# Patient Record
Sex: Female | Born: 1935 | Race: Black or African American | Hispanic: No | Smoking: Never smoker
Health system: Southern US, Community
[De-identification: ages and names within clinical notes are randomized; demographics above are authoritative.]

## PROBLEM LIST (undated history)

## (undated) DIAGNOSIS — K589 Irritable bowel syndrome without diarrhea: Secondary | ICD-10-CM

## (undated) DIAGNOSIS — E669 Obesity, unspecified: Secondary | ICD-10-CM

## (undated) DIAGNOSIS — E1169 Type 2 diabetes mellitus with other specified complication: Secondary | ICD-10-CM

## (undated) DIAGNOSIS — Z8719 Personal history of other diseases of the digestive system: Secondary | ICD-10-CM

## (undated) DIAGNOSIS — G8929 Other chronic pain: Secondary | ICD-10-CM

## (undated) DIAGNOSIS — Z96653 Presence of artificial knee joint, bilateral: Secondary | ICD-10-CM

## (undated) DIAGNOSIS — Z9889 Other specified postprocedural states: Secondary | ICD-10-CM

## (undated) DIAGNOSIS — K5792 Diverticulitis of intestine, part unspecified, without perforation or abscess without bleeding: Secondary | ICD-10-CM

## (undated) DIAGNOSIS — I10 Essential (primary) hypertension: Secondary | ICD-10-CM

## (undated) DIAGNOSIS — M1990 Unspecified osteoarthritis, unspecified site: Secondary | ICD-10-CM

## (undated) DIAGNOSIS — I252 Old myocardial infarction: Secondary | ICD-10-CM

## (undated) DIAGNOSIS — E119 Type 2 diabetes mellitus without complications: Secondary | ICD-10-CM

## (undated) DIAGNOSIS — G8190 Hemiplegia, unspecified affecting unspecified side: Secondary | ICD-10-CM

## (undated) DIAGNOSIS — Z8673 Personal history of transient ischemic attack (TIA), and cerebral infarction without residual deficits: Secondary | ICD-10-CM

## (undated) DIAGNOSIS — Z950 Presence of cardiac pacemaker: Secondary | ICD-10-CM

## (undated) DIAGNOSIS — J449 Chronic obstructive pulmonary disease, unspecified: Secondary | ICD-10-CM

## (undated) HISTORY — DX: Type 2 diabetes mellitus with other specified complication: E11.69

## (undated) HISTORY — DX: Personal history of other diseases of the digestive system: Z87.19

## (undated) HISTORY — PX: REPLACEMENT TOTAL KNEE BILATERAL: SUR1225

## (undated) HISTORY — DX: Essential (primary) hypertension: I10

## (undated) HISTORY — DX: Old myocardial infarction: I25.2

## (undated) HISTORY — DX: Diverticulitis of intestine, part unspecified, without perforation or abscess without bleeding: K57.92

## (undated) HISTORY — DX: Morbid (severe) obesity due to excess calories: E66.01

## (undated) HISTORY — DX: Other specified postprocedural states: Z98.890

## (undated) HISTORY — PX: CHOLECYSTECTOMY: SHX55

## (undated) HISTORY — DX: Obesity, unspecified: E66.9

## (undated) HISTORY — DX: Presence of artificial knee joint, bilateral: Z96.653

## (undated) HISTORY — DX: Chronic obstructive pulmonary disease, unspecified: J44.9

## (undated) HISTORY — DX: Hemiplegia, unspecified affecting unspecified side: G81.90

## (undated) HISTORY — DX: Type 2 diabetes mellitus without complications: E11.9

## (undated) HISTORY — DX: Unspecified osteoarthritis, unspecified site: M19.90

## (undated) HISTORY — DX: Irritable bowel syndrome without diarrhea: K58.9

## (undated) HISTORY — DX: Other chronic pain: G89.29

## (undated) HISTORY — PX: HERNIA REPAIR: SHX51

## (undated) HISTORY — DX: Personal history of transient ischemic attack (TIA), and cerebral infarction without residual deficits: Z86.73

## (undated) HISTORY — PX: PACEMAKER INSERTION: SHX728

## (undated) HISTORY — DX: Presence of cardiac pacemaker: Z95.0

---

## 2020-02-26 ENCOUNTER — Other Ambulatory Visit: Payer: Self-pay

## 2020-02-26 ENCOUNTER — Encounter (HOSPITAL_COMMUNITY): Payer: Self-pay | Admitting: Emergency Medicine

## 2020-02-26 ENCOUNTER — Emergency Department (HOSPITAL_COMMUNITY)
Admission: EM | Admit: 2020-02-26 | Discharge: 2020-02-26 | Disposition: A | Discharge status: Home / Self Care | Payer: Medicare Other | Attending: Emergency Medicine | Admitting: Emergency Medicine

## 2020-02-26 DIAGNOSIS — Z7901 Long term (current) use of anticoagulants: Secondary | ICD-10-CM | POA: Insufficient documentation

## 2020-02-26 DIAGNOSIS — Z794 Long term (current) use of insulin: Secondary | ICD-10-CM | POA: Insufficient documentation

## 2020-02-26 DIAGNOSIS — Z950 Presence of cardiac pacemaker: Secondary | ICD-10-CM | POA: Diagnosis not present

## 2020-02-26 DIAGNOSIS — I10 Essential (primary) hypertension: Secondary | ICD-10-CM | POA: Diagnosis not present

## 2020-02-26 DIAGNOSIS — E119 Type 2 diabetes mellitus without complications: Secondary | ICD-10-CM | POA: Diagnosis not present

## 2020-02-26 DIAGNOSIS — Z79899 Other long term (current) drug therapy: Secondary | ICD-10-CM | POA: Diagnosis not present

## 2020-02-26 DIAGNOSIS — R040 Epistaxis: Secondary | ICD-10-CM | POA: Diagnosis not present

## 2020-02-26 DIAGNOSIS — Z7982 Long term (current) use of aspirin: Secondary | ICD-10-CM | POA: Diagnosis not present

## 2020-02-26 MED ORDER — OXYMETAZOLINE HCL 0.05 % NA SOLN
1.0000 | Freq: Once | NASAL | Status: AC
  Administered 2020-02-26: 1 via NASAL
  Filled 2020-02-26: qty 30

## 2020-02-26 NOTE — ED Provider Notes (Signed)
[facility] EMERGENCY DEPARTMENT Provider Note   CSN: [id number] Arrival date & time: 02/26/20  0156     History Chief Complaint  Patient presents with  . Epistaxis    Terri Owen is a 87 y.o. female.  HPI 87 year old female with history of hypertension, post pacemaker placement and distant history, diabetes, UTI, presents today with nosebleed.  She reports that she had some bleeding coming from her left nares.  She has not had similar episodes in the past.  She can some control with pressure.  She denies any lightheadedness, other sites of bleeding, pain, or trauma.  He is on aspirin but no other blood thinner    History reviewed. No pertinent past medical history.  There are no problems to display for this patient.   History reviewed. No pertinent surgical history.   OB History   No obstetric history on file.     History reviewed. No pertinent family history.     Home Medications Prior to Admission medications   Medication Sig Start Date End Date Taking? Authorizing Provider  aspirin EC 81 MG tablet Take 81 mg by mouth daily. Swallow whole.   Yes [provider]  atorvastatin (LIPITOR) 40 MG tablet Take 40 mg by mouth daily.   Yes [provider]  Cholecalciferol (VITAMIN D3) 50 MCG (2000 UT) CAPS Take 2,000 Units by mouth daily.   Yes [provider]  DULoxetine (CYMBALTA) 30 MG capsule Take 30 mg by mouth daily.   Yes [provider]  ezetimibe (ZETIA) 10 MG tablet Take 10 mg by mouth daily.   Yes [provider]  furosemide (LASIX) 20 MG tablet Take 20 mg by mouth daily.   Yes [provider]  insulin glargine (LANTUS) 100 unit/mL SOPN Inject 20 Units into the skin at bedtime.   Yes [provider]  insulin lispro (HUMALOG) 100 UNIT/ML injection Inject 12 Units into the skin See admin instructions. 12 units base with 2-10 units sliding scale.  160-199=2u, 200-239=3u, 240-279=4u,  280-319=5u,320-359=6u,360-399=7u,400-439=8u,440-479=9u,480 and above= 10u   Yes [provider]  isosorbide mononitrate (IMDUR) 60 MG 24 hr tablet Take 60 mg by mouth daily.   Yes [provider]  losartan (COZAAR) 100 MG tablet Take 100 mg by mouth daily.   Yes [provider]  metoprolol tartrate (LOPRESSOR) 25 MG tablet Take 25 mg by mouth 2 (two) times daily.   Yes [provider]  Multiple Vitamins-Minerals (CENTRUM SILVER 50+WOMEN PO) Take 1 tablet by mouth daily.   Yes [provider]  PAPAYA ENZYME PO Take 3-4 tablets by mouth daily as needed (constipation).   Yes [provider]  simethicone (MYLICON) 125 MG chewable tablet Chew 125 mg by mouth See admin instructions. 2 tabs in the am, 1 tab hs   Yes [provider]  ticagrelor (BRILINTA) 60 MG TABS tablet Take 60 mg by mouth 2 (two) times daily.   Yes [provider]  Zinc 50 MG TABS Take 50 mg by mouth daily.   Yes [provider]    Allergies    Sulfa antibiotics  Review of Systems   Review of Systems  All other systems reviewed and are negative.   Physical Exam Updated Vital Signs BP (!) 142/58 (BP Location: Right Arm)   Pulse 68   Temp 98 F (36.7 C) (Oral)   Resp 16   SpO2 99%   Physical Exam Vitals and nursing note reviewed.  Constitutional:  Appearance: Normal appearance. She is obese.  HENT:     Head: Normocephalic.     Right Ear: External ear normal.     Left Ear: External ear normal.     Nose:     Comments: Clot noted noted in left nares After clot clear there is some erythematous area in the septum without active bleeding Right nares has some dried blood at the external nares but no clot no active bleeding    Mouth/Throat:     Comments: Dentures in place no blood noted in posterior oropharynx Eyes:     Pupils: Pupils are equal, round, and reactive to light.  Cardiovascular:     Rate and Rhythm: Normal rate.   Pulmonary:     Effort: Pulmonary effort is normal.  Abdominal:     Palpations: Abdomen is soft.  Musculoskeletal:     Cervical back: Normal range of motion.  Skin:    General: Skin is warm and dry.     Capillary Refill: Capillary refill takes less than 2 seconds.     Coloration: Skin is not pale.  Neurological:     General: No focal deficit present.     Mental Status: She is alert.  Psychiatric:        Mood and Affect: Mood normal.     ED Results / Procedures / Treatments   Labs (all labs ordered are listed, but only abnormal results are displayed) Labs Reviewed - No data to display  EKG None  Radiology No results found.  Procedures .Epistaxis Management  Date/Time: 02/26/2020 7:30 AM Performed by: Margarita Grizzle, MD Authorized by: Margarita Grizzle, MD   Consent:    Consent obtained:  Verbal   Consent given by:  Patient   Risks, benefits, and alternatives were discussed: yes     Risks discussed:  Bleeding   Alternatives discussed:  No treatment Universal protocol:    Patient identity confirmed:  Verbally with patient Anesthesia:    Anesthesia method:  Topical application   Topical anesthetic:  Lidocaine gel and epinephrine Procedure details:    Treatment site:  L anterior   Treatment method:  Silver nitrate   Treatment complexity:  Extensive   Treatment episode: initial   Post-procedure details:    Assessment:  Bleeding stopped   Procedure completion:  Tolerated well, no immediate complications   (including critical care time)  Medications Ordered in ED Medications  oxymetazoline (AFRIN) 0.05 % nasal spray 1 spray (has no administration in time range)    ED Course  I have reviewed the triage vital signs and the nursing notes.  Pertinent labs & imaging results that were available during my care of the patient were reviewed by me and considered in my medical decision making (see chart for details).    MDM Rules/Calculators/A&P                          87 year old female presents today with left anterior nasal bleeding.  Clot and noted and removed.  Bleeding controlled with silver nitrate.  Patient observed x45 minutes and continues to have bleeding. Discussed home epistaxis management including not blowing through nares, touching nares, sneezing with mouth open, bleeding control if there is recurrence with pressure.  Discussed return precautions and need for follow-up and voices understanding Final Clinical Impression(s) / ED Diagnoses Final diagnoses:  Epistaxis    Rx / DC Orders ED Discharge Orders    None       Satya Buttram,  Duwayne Heck, MD 02/26/20 [id number]

## 2020-02-26 NOTE — Discharge Instructions (Signed)
Please do not touch nose, sneeze with mouth open. Humidify air If bleeding recurs, use pressure as discussed and return if needed.

## 2020-02-26 NOTE — ED Triage Notes (Signed)
Pt was at home and she said he nose just started bleeding. Pt said she is only taken 81 mg aspirin. Pt said no injury no fall.

## 2020-02-26 NOTE — ED Notes (Signed)
Patient discharge instructions reviewed with the patient. The patient verbalized understanding of instructions. Patient discharged. 

## 2020-02-29 ENCOUNTER — Other Ambulatory Visit: Payer: Self-pay | Admitting: General Practice

## 2020-02-29 DIAGNOSIS — E2839 Other primary ovarian failure: Secondary | ICD-10-CM

## 2020-02-29 DIAGNOSIS — Z780 Asymptomatic menopausal state: Secondary | ICD-10-CM

## 2020-04-02 ENCOUNTER — Telehealth: Payer: Self-pay

## 2020-04-02 NOTE — Telephone Encounter (Signed)
NOTES ON FILE FROM [facility] [phone], SENT REFERRAL TO SCHEDULING 

## 2020-04-20 ENCOUNTER — Other Ambulatory Visit: Payer: Self-pay

## 2020-04-20 ENCOUNTER — Ambulatory Visit (INDEPENDENT_AMBULATORY_CARE_PROVIDER_SITE_OTHER): Payer: Medicare Other | Admitting: Cardiology

## 2020-04-20 VITALS — BP 120/60 | HR 73 | Ht 63.0 in | Wt 269.0 lb

## 2020-04-20 DIAGNOSIS — Z950 Presence of cardiac pacemaker: Secondary | ICD-10-CM | POA: Diagnosis not present

## 2020-04-20 DIAGNOSIS — I10 Essential (primary) hypertension: Secondary | ICD-10-CM

## 2020-04-20 NOTE — Patient Instructions (Signed)
Medication Instructions:  The current medical regimen is effective;  continue present plan and medications.  *If you need a refill on your cardiac medications before your next appointment, please call your pharmacy*  You have been referred to Electrophysiology to follow your pacemaker.  Follow-Up: At [facility], you and your health needs are our priority.  As part of our continuing mission to provide you with exceptional heart care, we have created designated Provider Care Teams.  These Care Teams include your primary Cardiologist (physician) and Advanced Practice Providers (APPs -  Physician Assistants and Nurse Practitioners) who all work together to provide you with the care you need, when you need it.  We recommend signing up for the patient portal called "MyChart".  Sign up information is provided on this After Visit Summary.  MyChart is used to connect with patients for Virtual Visits (Telemedicine).  Patients are able to view lab/test results, encounter notes, upcoming appointments, etc.  Non-urgent messages can be sent to your provider as well.   To learn more about what you can do with MyChart, go to [email or web address].    Your next appointment:   6 month(s)  The format for your next appointment:   In Person  Provider:   Donato Schultz, MD  Thank you for choosing [facility]!!

## 2020-04-20 NOTE — Progress Notes (Signed)
Cardiology Office Note:    Date:  04/20/2020   ID:  Terri Owen, DOB 1933-06-04, MRN [id number]  PCP:  Karl Ito, DO   [facility]  Cardiologist:  Donato Schultz, MD  Advanced Practice Provider:  No care team member to display Electrophysiologist:  None       Referring MD: Karl Ito, DO     History of Present Illness:    Terri Owen is a 87 y.o. female here for the evaluation of pacemaker, abnormal EKG at the request of Dr. Allena Katz.  Prior history of stroke in November 2021.  At that time she was placed on both Brilinta as well as aspirin but then developed significant nose bleeding issues.  Aspirin was stopped and she is doing better.  Her daughter accompanies her during this visit.  She moved from [place].  Previously both of them lived in [place].  Has diabetes hypertension, hemoglobin A1c 6.6 compliant.  Has also had some fatigue.   LDL 43 ALT 19 hemoglobin 11.8  Past Medical History:  Diagnosis Date  . Arthritis   . Chronic abdominal pain   . COPD (chronic obstructive pulmonary disease) (HCC)   . Diabetes mellitus type 2 in obese (HCC)   . Diverticulitis   . DM (diabetes mellitus) (HCC)   . H/O hernia repair   . H/O: stroke   . Hemiparesis (HCC)   . History of bilateral knee replacement   . History of MI (myocardial infarction)   . HTN (hypertension)   . IBS (irritable bowel syndrome)   . Morbidly obese (HCC)   . Pacemaker     Past Surgical History:  Procedure Laterality Date  . HERNIA REPAIR    . PACEMAKER INSERTION      Current Medications: Current Meds  Medication Sig  . amoxicillin-clavulanate (AUGMENTIN) 500-125 MG tablet Take 1 tablet by mouth See admin instructions. Every 8 hours x 10 days  . atorvastatin (LIPITOR) 40 MG tablet Take 40 mg by mouth daily.  . Cholecalciferol (VITAMIN D3) 50 MCG (2000 UT) CAPS Take 2,000 Units by mouth daily.  . DULoxetine (CYMBALTA) 30 MG capsule Take 30 mg by mouth daily.   Marland Kitchen ezetimibe (ZETIA) 10 MG tablet Take 10 mg by mouth daily.  . furosemide (LASIX) 20 MG tablet Take 20 mg by mouth daily.  . insulin glargine (LANTUS) 100 unit/mL SOPN Inject 20 Units into the skin at bedtime.  . insulin lispro (HUMALOG) 100 UNIT/ML injection Inject 12 Units into the skin See admin instructions. 12 units base with 2-10 units sliding scale.  160-199=2u, 200-239=3u, 240-279=4u, 280-319=5u,320-359=6u,360-399=7u,400-439=8u,440-479=9u,480 and above= 10u  . isosorbide mononitrate (IMDUR) 60 MG 24 hr tablet Take 60 mg by mouth daily.  Marland Kitchen losartan (COZAAR) 100 MG tablet Take 100 mg by mouth daily.  . metoprolol tartrate (LOPRESSOR) 25 MG tablet Take 25 mg by mouth 2 (two) times daily.  . Multiple Vitamins-Minerals (CENTRUM SILVER 50+WOMEN PO) Take 1 tablet by mouth daily.  Marland Kitchen PAPAYA ENZYME PO Take 3-4 tablets by mouth daily as needed (constipation).  . simethicone (MYLICON) 125 MG chewable tablet Chew 125 mg by mouth See admin instructions. 2 tabs in the am, 1 tab hs  . ticagrelor (BRILINTA) 60 MG TABS tablet Take 60 mg by mouth 2 (two) times daily.  . Zinc 50 MG TABS Take 50 mg by mouth daily.     Allergies:   Sulfa antibiotics   Social History   Socioeconomic History  . Marital status: Widowed  Spouse name: Not on file  . Number of children: Not on file  . Years of education: Not on file  . Highest education level: Not on file  Occupational History  . Not on file  Tobacco Use  . Smoking status: Not on file  . Smokeless tobacco: Not on file  Substance and Sexual Activity  . Alcohol use: Not on file  . Drug use: Not on file  . Sexual activity: Not on file  Other Topics Concern  . Not on file  Social History Narrative  . Not on file   Social Determinants of Health   Financial Resource Strain: Not on file  Food Insecurity: Not on file  Transportation Needs: Not on file  Physical Activity: Not on file  Stress: Not on file  Social Connections: Not on file       ROS:   Please see the history of present illness.     All other systems reviewed and are negative.  EKGs/Labs/Other Studies Reviewed:    The following studies were reviewed today: Records from [facility] health reviewed  EKG:  EKG is  ordered today.  The ekg ordered today demonstrates sinus rhythm with PACs left bundle branch block heart rate 73 bpm  Recent Labs: No results found for requested labs within last 8760 hours.  Recent Lipid Panel No results found for: CHOL, TRIG, HDL, CHOLHDL, VLDL, LDLCALC, LDLDIRECT   Risk Assessment/Calculations:      Physical Exam:    VS:  BP 120/60 (BP Location: Right Arm, Patient Position: Sitting, Cuff Size: Large)   Pulse 73   Ht 5\' 3"  (1.6 m)   Wt 269 lb (122 kg)   BMI 47.65 kg/m     Wt Readings from Last 3 Encounters:  04/20/20 269 lb (122 kg)     GEN:  Well nourished, well developed in no acute distress HEENT: Normal NECK: No JVD; No carotid bruits LYMPHATICS: No lymphadenopathy CARDIAC: ectopy RRR, no murmurs, rubs, gallops RESPIRATORY:  Clear to auscultation without rales, wheezing or rhonchi  ABDOMEN: Soft, non-tender, non-distended MUSCULOSKELETAL:  No edema; No deformity  SKIN: Warm and dry NEUROLOGIC:  Alert and oriented x 3 PSYCHIATRIC:  Normal affect   ASSESSMENT:    1. Hypertension, unspecified type   2. Artificial cardiac pacemaker    PLAN:    In order of problems listed above:  Stroke November 2021 -Was on both Brilinta as well as aspirin post stroke.  Had severe nosebleeds.  Aspirin was taken off.  She is now on Brilinta only and doing well.  Okay to continue with Brilinta at this point.  If bleeding were to occur again we would switch over to aspirin monotherapy.  [employer]tronic pacemaker -Just replaced last year.  We will set up with pacemaker device clinic here.  This was placed in December 2021.  Left bundle branch block -Noted on ECG, no syncope.  PACs noted as well.  Diabetes with hypertension and  hyperlipidemia -Currently seeing Dr. [place] with [facility].    Medication Adjustments/Labs and Tests Ordered: Current medicines are reviewed at length with the patient today.  Concerns regarding medicines are outlined above.  Orders Placed This Encounter  Procedures  . Ambulatory referral to Cardiac Electrophysiology  . EKG 12-Lead   No orders of the defined types were placed in this encounter.   Patient Instructions  Medication Instructions:  The current medical regimen is effective;  continue present plan and medications.  *If you need a refill on your cardiac  medications before your next appointment, please call your pharmacy*  You have been referred to Electrophysiology to follow your pacemaker.  Follow-Up: At [facility], you and your health needs are our priority.  As part of our continuing mission to provide you with exceptional heart care, we have created designated Provider Care Teams.  These Care Teams include your primary Cardiologist (physician) and Advanced Practice Providers (APPs -  Physician Assistants and Nurse Practitioners) who all work together to provide you with the care you need, when you need it.  We recommend signing up for the patient portal called "MyChart".  Sign up information is provided on this After Visit Summary.  MyChart is used to connect with patients for Virtual Visits (Telemedicine).  Patients are able to view lab/test results, encounter notes, upcoming appointments, etc.  Non-urgent messages can be sent to your provider as well.   To learn more about what you can do with MyChart, go to [email or web address].    Your next appointment:   6 month(s)  The format for your next appointment:   In Person  Provider:   Donato Schultz, MD  Thank you for choosing [facility]!!        Signed, Donato Schultz, MD  04/20/2020 3:00 PM    [facility]

## 2020-04-27 ENCOUNTER — Encounter: Payer: Self-pay | Admitting: Cardiology

## 2020-04-27 ENCOUNTER — Ambulatory Visit (INDEPENDENT_AMBULATORY_CARE_PROVIDER_SITE_OTHER): Payer: Medicare Other | Admitting: Cardiology

## 2020-04-27 ENCOUNTER — Other Ambulatory Visit: Payer: Self-pay

## 2020-04-27 VITALS — BP 144/70 | HR 72 | Ht 63.0 in | Wt 268.4 lb

## 2020-04-27 DIAGNOSIS — Z950 Presence of cardiac pacemaker: Secondary | ICD-10-CM | POA: Insufficient documentation

## 2020-04-27 DIAGNOSIS — R001 Bradycardia, unspecified: Secondary | ICD-10-CM | POA: Diagnosis not present

## 2020-04-27 DIAGNOSIS — I10 Essential (primary) hypertension: Secondary | ICD-10-CM | POA: Diagnosis not present

## 2020-04-27 NOTE — Progress Notes (Signed)
Electrophysiology Office Note:    Date:  04/27/2020   ID:  Terri Owen, DOB Jan 28, 1934, MRN [id number]  PCP:  Karl Ito, DO  [facility] Cardiologist:  Donato Schultz, MD  [facility] Electrophysiologist:  Lanier Prude, MD   Referring MD: Jake Bathe, MD   Chief Complaint: Permanent pacemaker  History of Present Illness:    Terri Owen is a 87 y.o. female who presents for an evaluation of permanent pacemaker at the request of Dr. Anne Fu. Their medical history includes diabetes, COPD, stroke, MI, hypertension, morbid obesity.  She is in clinic today to establish care.  She last saw Dr. Anne Fu on April 20, 2020.   Her permanent pacemaker was implanted at [facility] in [place].  Past Medical History:  Diagnosis Date  . Arthritis   . Chronic abdominal pain   . COPD (chronic obstructive pulmonary disease) (HCC)   . Diabetes mellitus type 2 in obese (HCC)   . Diverticulitis   . DM (diabetes mellitus) (HCC)   . H/O hernia repair   . H/O: stroke   . Hemiparesis (HCC)   . History of bilateral knee replacement   . History of MI (myocardial infarction)   . HTN (hypertension)   . IBS (irritable bowel syndrome)   . Morbidly obese (HCC)   . Pacemaker     Past Surgical History:  Procedure Laterality Date  . HERNIA REPAIR    . PACEMAKER INSERTION      Current Medications: Current Meds  Medication Sig  . amoxicillin-clavulanate (AUGMENTIN) 500-125 MG tablet Take 1 tablet by mouth See admin instructions. Every 8 hours x 10 days  . atorvastatin (LIPITOR) 40 MG tablet Take 40 mg by mouth daily.  . Cholecalciferol (VITAMIN D3) 50 MCG (2000 UT) CAPS Take 2,000 Units by mouth daily.  . DULoxetine (CYMBALTA) 30 MG capsule Take 30 mg by mouth daily.  Marland Kitchen ezetimibe (ZETIA) 10 MG tablet Take 10 mg by mouth daily.  . furosemide (LASIX) 20 MG tablet Take 20 mg by mouth daily.  . insulin glargine (LANTUS) 100 unit/mL SOPN Inject 20 Units into the skin at bedtime.   . insulin lispro (HUMALOG) 100 UNIT/ML injection Inject 12 Units into the skin See admin instructions. 12 units base with 2-10 units sliding scale.  160-199=2u, 200-239=3u, 240-279=4u, 280-319=5u,320-359=6u,360-399=7u,400-439=8u,440-479=9u,480 and above= 10u  . isosorbide mononitrate (IMDUR) 60 MG 24 hr tablet Take 60 mg by mouth daily.  Marland Kitchen losartan (COZAAR) 100 MG tablet Take 100 mg by mouth daily.  . metoprolol tartrate (LOPRESSOR) 25 MG tablet Take 25 mg by mouth 2 (two) times daily.  . Multiple Vitamins-Minerals (CENTRUM SILVER 50+WOMEN PO) Take 1 tablet by mouth daily.  Marland Kitchen PAPAYA ENZYME PO Take 3-4 tablets by mouth daily as needed (constipation).  . simethicone (MYLICON) 125 MG chewable tablet Chew 125 mg by mouth See admin instructions. 2 tabs in the am, 1 tab hs  . ticagrelor (BRILINTA) 60 MG TABS tablet Take 60 mg by mouth 2 (two) times daily.  . Zinc 50 MG TABS Take 50 mg by mouth daily.     Allergies:   Sulfa antibiotics   Social History   Socioeconomic History  . Marital status: Widowed    Spouse name: Not on file  . Number of children: Not on file  . Years of education: Not on file  . Highest education level: Not on file  Occupational History  . Not on file  Tobacco Use  . Smoking status: Never Smoker  . Smokeless  tobacco: Never Used  Substance and Sexual Activity  . Alcohol use: Not on file  . Drug use: Not on file  . Sexual activity: Not on file  Other Topics Concern  . Not on file  Social History Narrative  . Not on file   Social Determinants of Health   Financial Resource Strain: Not on file  Food Insecurity: Not on file  Transportation Needs: Not on file  Physical Activity: Not on file  Stress: Not on file  Social Connections: Not on file     Family History: The patient's family history is not on file.  ROS:   Please see the history of present illness.    All other systems reviewed and are negative.  EKGs/Labs/Other Studies Reviewed:    The  following studies were reviewed today:  April 27, 2020 device interrogation personally reviewed Lead parameters stable Battery longevity good Atrially pacing 38%, ventricular pacing 1.6% 0% A. fib burden    Recent Labs: No results found for requested labs within last 8760 hours.  Recent Lipid Panel No results found for: CHOL, TRIG, HDL, CHOLHDL, VLDL, LDLCALC, LDLDIRECT  Physical Exam:    VS:  BP (!) 144/70   Pulse 72   Ht 5\' 3"  (1.6 m)   Wt 268 lb 6.4 oz (121.7 kg)   SpO2 98%   BMI 47.54 kg/m     Wt Readings from Last 3 Encounters:  04/27/20 268 lb 6.4 oz (121.7 kg)  04/20/20 269 lb (122 kg)     GEN:  Well nourished, well developed in no acute distress.  Morbidly obese HEENT: Normal NECK: No JVD; No carotid bruits LYMPHATICS: No lymphadenopathy CARDIAC: RRR, no murmurs, rubs, gallops pacemaker pocket well-healed RESPIRATORY:  Clear to auscultation without rales, wheezing or rhonchi  ABDOMEN: Soft, non-tender, non-distended MUSCULOSKELETAL:  No edema; No deformity  SKIN: Warm and dry NEUROLOGIC:  Alert and oriented x 3 PSYCHIATRIC:  Normal affect   ASSESSMENT:    1. Primary hypertension   2. Bradycardia   3. Pacemaker    PLAN:    In order of problems listed above:  1. Sinus node dysfunction Post permanent pacemaker implanted in 04/22/20 in August 2021.  Device is functioning well.  Good battery and longevity.  We will establish her for remote monitoring in our device clinic and plan to see her back in approximately 1 year.  2. Hypertension Continue current regimen including losartan, metoprolol, Imdur, Lasix  Medication Adjustments/Labs and Tests Ordered: Current medicines are reviewed at length with the patient today.  Concerns regarding medicines are outlined above.  No orders of the defined types were placed in this encounter.  No orders of the defined types were placed in this encounter.    Signed, September 2021, MD, [facility]  04/27/2020 10:00 PM     Electrophysiology [facility]

## 2020-04-27 NOTE — Patient Instructions (Signed)
Medication Instructions:  Your physician recommends that you continue on your current medications as directed. Please refer to the Current Medication list given to you today. *If you need a refill on your cardiac medications before your next appointment, please call your pharmacy*  Lab Work: None ordered. If you have labs (blood work) drawn today and your tests are completely normal, you will receive your results only by: Marland Kitchen MyChart Message (if you have MyChart) OR . A paper copy in the mail If you have any lab test that is abnormal or we need to change your treatment, we will call you to review the results.  Testing/Procedures: None ordered.  Follow-Up: At [facility], you and your health needs are our priority.  As part of our continuing mission to provide you with exceptional heart care, we have created designated Provider Care Teams.  These Care Teams include your primary Cardiologist (physician) and Advanced Practice Providers (APPs -  Physician Assistants and Nurse Practitioners) who all work together to provide you with the care you need, when you need it.  Your next appointment:   Your physician wants you to follow-up in: one year with Dr. Lalla Brothers.   You will receive a reminder letter in the mail two months in advance. If you don't receive a letter, please call our office to schedule the follow-up appointment.  You will be set up with remote monitoring.  Device clinic [phone]

## 2020-06-26 ENCOUNTER — Other Ambulatory Visit: Payer: Self-pay

## 2020-06-26 ENCOUNTER — Emergency Department (HOSPITAL_BASED_OUTPATIENT_CLINIC_OR_DEPARTMENT_OTHER): Payer: Medicare Other

## 2020-06-26 ENCOUNTER — Encounter (HOSPITAL_BASED_OUTPATIENT_CLINIC_OR_DEPARTMENT_OTHER): Payer: Self-pay

## 2020-06-26 ENCOUNTER — Emergency Department (HOSPITAL_BASED_OUTPATIENT_CLINIC_OR_DEPARTMENT_OTHER)
Admission: EM | Admit: 2020-06-26 | Discharge: 2020-06-26 | Disposition: A | Discharge status: Home / Self Care | Payer: Medicare Other | Attending: Emergency Medicine | Admitting: Emergency Medicine

## 2020-06-26 DIAGNOSIS — Z79899 Other long term (current) drug therapy: Secondary | ICD-10-CM | POA: Insufficient documentation

## 2020-06-26 DIAGNOSIS — Z794 Long term (current) use of insulin: Secondary | ICD-10-CM | POA: Diagnosis not present

## 2020-06-26 DIAGNOSIS — E1169 Type 2 diabetes mellitus with other specified complication: Secondary | ICD-10-CM | POA: Diagnosis not present

## 2020-06-26 DIAGNOSIS — J449 Chronic obstructive pulmonary disease, unspecified: Secondary | ICD-10-CM | POA: Diagnosis not present

## 2020-06-26 DIAGNOSIS — W06XXXA Fall from bed, initial encounter: Secondary | ICD-10-CM | POA: Insufficient documentation

## 2020-06-26 DIAGNOSIS — Z950 Presence of cardiac pacemaker: Secondary | ICD-10-CM | POA: Insufficient documentation

## 2020-06-26 DIAGNOSIS — I10 Essential (primary) hypertension: Secondary | ICD-10-CM | POA: Insufficient documentation

## 2020-06-26 DIAGNOSIS — Z7902 Long term (current) use of antithrombotics/antiplatelets: Secondary | ICD-10-CM | POA: Insufficient documentation

## 2020-06-26 DIAGNOSIS — Y92009 Unspecified place in unspecified non-institutional (private) residence as the place of occurrence of the external cause: Secondary | ICD-10-CM | POA: Insufficient documentation

## 2020-06-26 DIAGNOSIS — Z96653 Presence of artificial knee joint, bilateral: Secondary | ICD-10-CM | POA: Insufficient documentation

## 2020-06-26 DIAGNOSIS — M25552 Pain in left hip: Secondary | ICD-10-CM | POA: Diagnosis present

## 2020-06-26 NOTE — ED Triage Notes (Addendum)
Pt arrives with her daughter.  Daughter reports a fall at home approximately one week ago.  Since the fall pt has been experiencing left leg swelling and leg "giving away".  Pt arrives using rolling walker for ambulation assistance.  Daughter reports history of stroke in August, 2021.  Reports bilateral knee replacement.

## 2020-06-26 NOTE — ED Provider Notes (Signed)
[facility] EMERGENCY DEPARTMENT Provider Note   CSN: [id number] Arrival date & time: 06/26/20  1223     History Chief Complaint  Patient presents with  . Hip Pain         Terri Owen is a 87 y.o. female.  Patient fell and landed on her left hip last week.  Did not come for evaluation.  Uses a sit walker at all times.  Fell on a transfer from her bed to her walker while try to go to the bathroom last week.  Did not hit her head.  Not on blood thinners.  Having some pain in the left knee now as well.  Not on blood thinners.  The history is provided by the patient.  Hip Pain This is a new problem. The current episode started more than 1 week ago. The problem has not changed since onset.Pertinent negatives include no chest pain, no abdominal pain and no shortness of breath. The symptoms are aggravated by standing. She has tried nothing for the symptoms. The treatment provided no relief.       Past Medical History:  Diagnosis Date  . Arthritis   . Chronic abdominal pain   . COPD (chronic obstructive pulmonary disease) (HCC)   . Diabetes mellitus type 2 in obese (HCC)   . Diverticulitis   . DM (diabetes mellitus) (HCC)   . H/O hernia repair   . H/O: stroke   . Hemiparesis (HCC)   . History of bilateral knee replacement   . History of MI (myocardial infarction)   . HTN (hypertension)   . IBS (irritable bowel syndrome)   . Morbidly obese (HCC)   . Pacemaker     Patient Active Problem List   Diagnosis Date Noted  . Bradycardia 04/27/2020  . Pacemaker 04/27/2020    Past Surgical History:  Procedure Laterality Date  . HERNIA REPAIR    . PACEMAKER INSERTION       OB History   No obstetric history on file.     No family history on file.  Social History   Tobacco Use  . Smoking status: Never Smoker  . Smokeless tobacco: Never Used    Home Medications Prior to Admission medications   Medication Sig Start Date End Date Taking? Authorizing  Provider  atorvastatin (LIPITOR) 40 MG tablet Take 40 mg by mouth daily.   Yes [provider]  Cholecalciferol (VITAMIN D3) 50 MCG (2000 UT) CAPS Take 2,000 Units by mouth daily.   Yes [provider]  DULoxetine (CYMBALTA) 30 MG capsule Take 30 mg by mouth daily.   Yes [provider]  furosemide (LASIX) 20 MG tablet Take 20 mg by mouth daily.   Yes [provider]  insulin glargine (LANTUS) 100 unit/mL SOPN Inject 20 Units into the skin at bedtime.   Yes [provider]  insulin lispro (HUMALOG) 100 UNIT/ML injection Inject 12 Units into the skin See admin instructions. 12 units base with 2-10 units sliding scale.  160-199=2u, 200-239=3u, 240-279=4u, 280-319=5u,320-359=6u,360-399=7u,400-439=8u,440-479=9u,480 and above= 10u   Yes [provider]  isosorbide mononitrate (IMDUR) 60 MG 24 hr tablet Take 60 mg by mouth daily.   Yes [provider]  losartan (COZAAR) 100 MG tablet Take 100 mg by mouth daily.   Yes [provider]  metoprolol tartrate (LOPRESSOR) 25 MG tablet Take 25 mg by mouth 2 (two) times daily.   Yes [provider]  Multiple Vitamins-Minerals (CENTRUM SILVER 50+WOMEN PO) Take 1 tablet by mouth  daily.   Yes [provider]  simethicone (MYLICON) 125 MG chewable tablet Chew 125 mg by mouth See admin instructions. 2 tabs in the am, 1 tab hs   Yes [provider]  ticagrelor (BRILINTA) 60 MG TABS tablet Take 60 mg by mouth 2 (two) times daily.   Yes [provider]  Zinc 50 MG TABS Take 50 mg by mouth daily.   Yes [provider]  amoxicillin-clavulanate (AUGMENTIN) 500-125 MG tablet Take 1 tablet by mouth See admin instructions. Every 8 hours x 10 days    [provider]  ezetimibe (ZETIA) 10 MG tablet Take 10 mg by mouth daily.    [provider]  PAPAYA ENZYME PO Take 3-4 tablets by mouth daily as needed (constipation).    [provider]     Allergies    Sulfa antibiotics  Review of Systems   Review of Systems  Constitutional: Negative for chills and fever.  HENT: Negative for ear pain and sore throat.   Eyes: Negative for pain and visual disturbance.  Respiratory: Negative for cough and shortness of breath.   Cardiovascular: Negative for chest pain and palpitations.  Gastrointestinal: Negative for abdominal pain and vomiting.  Genitourinary: Negative for dysuria and hematuria.  Musculoskeletal: Positive for arthralgias and gait problem. Negative for back pain.  Skin: Negative for color change and rash.  Neurological: Negative for seizures, syncope, weakness and numbness.  All other systems reviewed and are negative.   Physical Exam Updated Vital Signs BP (!) 149/73 (BP Location: Right Arm)   Pulse (!) 120   Temp 98.6 F (37 C) (Oral)   Resp 18   Ht 5\' 3"  (1.6 m)   Wt 126.1 kg   SpO2 100%   BMI 49.25 kg/m   Physical Exam Vitals and nursing note reviewed.  Constitutional:      General: She is not in acute distress.    Appearance: She is well-developed.  HENT:     Head: Normocephalic and atraumatic.  Eyes:     Conjunctiva/sclera: Conjunctivae normal.  Cardiovascular:     Rate and Rhythm: Normal rate and regular rhythm.     Pulses: Normal pulses.     Heart sounds: No murmur heard.   Pulmonary:     Effort: Pulmonary effort is normal. No respiratory distress.     Breath sounds: Normal breath sounds.  Abdominal:     Palpations: Abdomen is soft.     Tenderness: There is no abdominal tenderness.  Musculoskeletal:        General: Tenderness present. No deformity. Normal range of motion.     Cervical back: Normal range of motion and neck supple. No tenderness.     Comments: Tenderness to the left hip area, some may be mild swelling to the left knee compared to the right knee but overall legs appear to be symmetrically swollen and appear chronic in nature  Skin:    General: Skin is warm and dry.      Findings: No bruising.  Neurological:     General: No focal deficit present.     Mental Status: She is alert.     Sensory: No sensory deficit.     Motor: No weakness.     ED Results / Procedures / Treatments   Labs (all labs ordered are listed, but only abnormal results are displayed) Labs Reviewed - No data to display  EKG None  Radiology DG Knee Complete 4 Views Left  Result Date: 06/26/2020 CLINICAL DATA:  Pain in left hip from fall 8 days ago. Bilateral knee pain. EXAM: LEFT KNEE - COMPLETE 4+ VIEW COMPARISON:  No pertinent prior exams available for comparison. FINDINGS: Prior left knee arthroplasty. No evidence of hardware compromise. The femoral and tibial components appear well seated. There is normal bony alignment. No evidence of acute osseous or articular abnormality. The joint spaces are maintained. IMPRESSION: No evidence of acute osseous or articular abnormality. Prior left knee arthroplasty. Electronically Signed   By: Jackey Loge DO   On: 06/26/2020 14:00   DG Hip Unilat With Pelvis 2-3 Views Left  Result Date: 06/26/2020 CLINICAL DATA:  Pain in left hip from fall 8 days ago. EXAM: DG HIP (WITH OR WITHOUT PELVIS) 2-3V LEFT COMPARISON:  No pertinent prior exams available for comparison. FINDINGS: There is normal bony alignment. No evidence of acute osseous or articular abnormality. The joint spaces are maintained. Moderate degenerative changes of the femoroacetabular joints (greater on the right). Degenerative changes also present at the pubic symphysis, sacroiliac joints and within the visualized lower lumbar spine. IMPRESSION: No evidence of acute osseous or articular abnormality. Moderate degenerative changes of the femoroacetabular joints (greater on the right). Electronically Signed   By: Jackey Loge DO   On: 06/26/2020 13:58    Procedures Procedures   Medications Ordered in ED Medications - No data to display  ED Course  I have reviewed the triage vital signs  and the nursing notes.  Pertinent labs & imaging results that were available during my care of the patient were reviewed by me and considered in my medical decision making (see chart for details).    MDM Rules/Calculators/A&P                          Joydan Kukla is an 87 year old female with history of diabetes, stroke, COPD, bilateral knee replacement who presents to the ED with left hip pain after fall last week.  Patient uses a sit walker at baseline.  Larey Seat last week on her left hip after trying to do transfer.  Has been having ongoing left hip pain but able to tolerate her transfers.  Having some left knee pain now as well but no new falls.  We will get x-rays of the hip and knee.  Overall she appears well otherwise.  Did not hit her head.  Not on blood thinners.  Suspect fracture versus arthritic process versus contusion.  X-ray showed no fracture.  Overall suspect contusion.  Discharged in ED in good condition.  This chart was dictated using voice recognition software.  Despite best efforts to proofread,  errors can occur which can change the documentation meaning.    Final Clinical Impression(s) / ED Diagnoses Final diagnoses:  Left hip pain    Rx / DC Orders ED Discharge Orders    None       Virgina Norfolk, DO 06/26/20 1404

## 2020-07-30 ENCOUNTER — Other Ambulatory Visit: Payer: Self-pay | Admitting: Cardiology

## 2020-09-12 ENCOUNTER — Ambulatory Visit: Payer: Medicare Other | Admitting: Neurology

## 2020-09-24 ENCOUNTER — Other Ambulatory Visit: Payer: Self-pay

## 2020-09-24 ENCOUNTER — Other Ambulatory Visit (HOSPITAL_COMMUNITY): Payer: Self-pay | Admitting: Family Medicine

## 2020-09-24 ENCOUNTER — Ambulatory Visit (INDEPENDENT_AMBULATORY_CARE_PROVIDER_SITE_OTHER): Payer: Medicare Other | Admitting: Neurology

## 2020-09-24 ENCOUNTER — Encounter: Payer: Self-pay | Admitting: Neurology

## 2020-09-24 VITALS — BP 137/67 | HR 59

## 2020-09-24 DIAGNOSIS — Z950 Presence of cardiac pacemaker: Secondary | ICD-10-CM

## 2020-09-24 DIAGNOSIS — E7849 Other hyperlipidemia: Secondary | ICD-10-CM | POA: Diagnosis not present

## 2020-09-24 DIAGNOSIS — I10 Essential (primary) hypertension: Secondary | ICD-10-CM

## 2020-09-24 DIAGNOSIS — Z8673 Personal history of transient ischemic attack (TIA), and cerebral infarction without residual deficits: Secondary | ICD-10-CM

## 2020-09-24 DIAGNOSIS — R29898 Other symptoms and signs involving the musculoskeletal system: Secondary | ICD-10-CM

## 2020-09-24 NOTE — Patient Instructions (Addendum)
I had a long d/w patient and her daughter   Stroke Prevention Some medical conditions and behaviors are associated with a higher chance of having a stroke. You can help prevent a stroke by making nutrition, lifestyle,and other changes, including managing any medical conditions you may have. What nutrition changes can be made?  Eat healthy foods. You can do this by: Choosing foods high in fiber, such as fresh fruits and vegetables and whole grains. Eating at least 5 or more servings of fruits and vegetables a day. Try to fill half of your plate at each meal with fruits and vegetables. Choosing lean protein foods, such as lean cuts of meat, poultry without skin, fish, tofu, beans, and nuts. Eating low-fat dairy products. Avoiding foods that are high in salt (sodium). This can help lower blood pressure. Avoiding foods that have saturated fat, trans fat, and cholesterol. This can help prevent high cholesterol. Avoiding processed and premade foods. Follow your health care provider's specific guidelines for losing weight, controlling high blood pressure (hypertension), lowering high cholesterol, and managing diabetes. These may include: Reducing your daily calorie intake. Limiting your daily sodium intake to 1,500 milligrams (mg). Using only healthy fats for cooking, such as olive oil, canola oil, or sunflower oil. Counting your daily carbohydrate intake. What lifestyle changes can be made? Maintain a healthy weight. Talk to your health care provider about your ideal weight. Get at least 30 minutes of moderate physical activity at least 5 days a week. Moderate activity includes brisk walking, biking, and swimming. Do not use any products that contain nicotine or tobacco, such as cigarettes and e-cigarettes. If you need help quitting, ask your health care provider. It may also be helpful to avoid exposure to secondhand smoke. Limit alcohol intake to no more than 1 drink a day for nonpregnant women  and 2 drinks a day for men. One drink equals 12 oz of beer, 5 oz of wine, or 1 oz of hard liquor. Stop any illegal drug use. Avoid taking birth control pills. Talk to your health care provider about the risks of taking birth control pills if: You are over 67 years old. You smoke. You get migraines. You have ever had a blood clot. What other changes can be made? Manage your cholesterol levels. Eating a healthy diet is important for preventing high cholesterol. If cholesterol cannot be managed through diet alone, you may also need to take medicines. Take any prescribed medicines to control your cholesterol as told by your health care provider. Manage your diabetes. Eating a healthy diet and exercising regularly are important parts of managing your blood sugar. If your blood sugar cannot be managed through diet and exercise, you may need to take medicines. Take any prescribed medicines to control your diabetes as told by your health care provider. Control your hypertension. To reduce your risk of stroke, try to keep your blood pressure below 130/80. Eating a healthy diet and exercising regularly are an important part of controlling your blood pressure. If your blood pressure cannot be managed through diet and exercise, you may need to take medicines. Take any prescribed medicines to control hypertension as told by your health care provider. Ask your health care provider if you should monitor your blood pressure at home. Have your blood pressure checked every year, even if your blood pressure is normal. Blood pressure increases with age and some medical conditions. Get evaluated for sleep disorders (sleep apnea). Talk to your health care provider about getting a sleep evaluation if  you snore a lot or have excessive sleepiness. Take over-the-counter and prescription medicines only as told by your health care provider. Aspirin or blood thinners (antiplatelets or anticoagulants) may be recommended to  reduce your risk of forming blood clots that can lead to stroke. Make sure that any other medical conditions you have, such as atrial fibrillation or atherosclerosis, are managed. What are the warning signs of a stroke? The warning signs of a stroke can be easily remembered as BEFAST. B is for balance. Signs include: Dizziness. Loss of balance or coordination. Sudden trouble walking. E is for eyes. Signs include: A sudden change in vision. Trouble seeing. F is for face. Signs include: Sudden weakness or numbness of the face. The face or eyelid drooping to one side. A is for arms. Signs include: Sudden weakness or numbness of the arm, usually on one side of the body. S is for speech. Signs include: Trouble speaking (aphasia). Trouble understanding. T is for time. These symptoms may represent a serious problem that is an emergency. Do not wait to see if the symptoms will go away. Get medical help right away. Call your local emergency services (911 in the [place] Do not drive yourself to the hospital. Other signs of stroke may include: A sudden, severe headache with no known cause. Nausea or vomiting. Seizure. Where to find more information For more information, visit: American Stroke Association: www.strokeassociation.org National Stroke Association: www.stroke.org Summary You can prevent a stroke by eating healthy, exercising, not smoking, limiting alcohol intake, and managing any medical conditions you may have. Do not use any products that contain nicotine or tobacco, such as cigarettes and e-cigarettes. If you need help quitting, ask your health care provider. It may also be helpful to avoid exposure to secondhand smoke. Remember BEFAST for warning signs of stroke. Get help right away if you or a loved one has any of these signs. This information is not intended to replace advice given to you by your health care provider. Make sure you discuss any questions you have with your  healthcare provider. Document Revised: 01/02/2017 Document Reviewed: 02/26/2016 Elsevier Patient Education  2021 [employer]. about her remote stroke and right hand weakness stroke, risk for recurrent stroke/TIAs, personally independently reviewed imaging studies and stroke evaluation results and answered questions.Continue Brilinta (ticagrelor) 90 mg bid  for secondary stroke prevention and maintain strict control of hypertension with blood pressure goal below 130/90, diabetes with hemoglobin A1c goal below 6.5% and lipids with LDL cholesterol goal below 70 mg/dL. I also advised the patient to eat a healthy diet with plenty of whole grains, cereals, fruits and vegetables, exercise regularly and maintain ideal body weight check lipid profile and hemoglobin A1c as well as screening carotid ultrasound and transcranial Doppler studies.  Followup in the future with my nurse practitioner Shanda Bumps in 3 months or call earlier if necessary.

## 2020-09-24 NOTE — Progress Notes (Signed)
[facility] [address] [place]. [place] [address] [phone]       OFFICE CONSULT NOTE  Ms. Rulon Abide Date of Birth:  07/15/1933 Medical Record Number:  [id number]   Referring MD: Dominica Severin, DO  Reason for Referral: Stroke follow-up  HPI: Terri Owen is a 87 year old African-American lady seen today for initial office consultation visit for stroke.  She is accompanied by her daughter Darel Hong.  History is obtained from them and review of referral notes.  No imaging studies , results or electronic medical records available for review today.  Patient has past medical history of diabetes, hypertension, hyperlipidemia, irritable bowel syndrome, pacemaker and COPD.  She states she was living in [place] , [place] is suburb of [place] when she had a stroke in August 2021.  She had some initial speech difficulties and right hemiparesis.  She was admitted there for a few days and then recovered enough to be discharged home get some home therapies.  She still has some residual right grip weakness and diminished fine motor skills but speech and walking had returned back to normal.  She was in fact independent with actives of daily living until a few months ago when she fell and she has been having chronic hip pain.  She has not a candidate for surgery due to her poor general medical health.  Patient spends most of the time now ambulating in a wheelchair.  She has not had any recurrent stroke or TIA symptoms.  She has been placed on Brilinta which is tolerating well at present without bleeding or bruising.  She was on aspirin and Brilinta in the past but developed nasal bleeding and had to see ENT and aspirin was discontinued.  She had lipid profile checked on 02/24/2020 which showed LDL cholesterol 43 mg percent and hemoglobin A1c of 6.6.  She has not had any follow-up carotid ultrasound done recently.  ROS:   14 system review of systems is positive for hip pain, difficulty  walking, and weakness, memory difficulties all other systems negative  PMH:  Past Medical History:  Diagnosis Date   Arthritis    Chronic abdominal pain    COPD (chronic obstructive pulmonary disease) (HCC)    Diabetes mellitus type 2 in obese (HCC)    Diverticulitis    DM (diabetes mellitus) (HCC)    H/O hernia repair    H/O: stroke    Hemiparesis (HCC)    History of bilateral knee replacement    History of MI (myocardial infarction)    HTN (hypertension)    IBS (irritable bowel syndrome)    Morbidly obese (HCC)    Pacemaker     Social History:  Social History   Socioeconomic History   Marital status: Widowed    Spouse name: Not on file   Number of children: 6   Years of education: 10th grade   Highest education level: Not on file  Occupational History   Occupation: Retired  Tobacco Use   Smoking status: Never   Smokeless tobacco: Never  Substance and Sexual Activity   Alcohol use: Not Currently   Drug use: Never   Sexual activity: Not on file  Other Topics Concern   Not on file  Social History Narrative   Her daughter live with her at home.   Right-handed.   One cup coffee, one soda per day.   Social Determinants of Health   Financial Resource Strain: Not on file  Food Insecurity: Not on file  Transportation  Needs: Not on file  Physical Activity: Not on file  Stress: Not on file  Social Connections: Not on file  Intimate Partner Violence: Not on file    Medications:   Current Outpatient Medications on File Prior to Visit  Medication Sig Dispense Refill   amoxicillin-clavulanate (AUGMENTIN) 500-125 MG tablet Take 1 tablet by mouth See admin instructions. Every 8 hours x 10 days     atorvastatin (LIPITOR) 40 MG tablet Take 40 mg by mouth daily.     Cholecalciferol (VITAMIN D3) 50 MCG (2000 UT) CAPS Take 2,000 Units by mouth daily.     DULoxetine (CYMBALTA) 30 MG capsule Take 30 mg by mouth daily.     ezetimibe (ZETIA) 10 MG tablet Take 10 mg by mouth  daily.     furosemide (LASIX) 20 MG tablet Take 20 mg by mouth daily.     insulin glargine (LANTUS) 100 unit/mL SOPN Inject 20 Units into the skin at bedtime.     insulin lispro (HUMALOG) 100 UNIT/ML injection Inject 12 Units into the skin See admin instructions. 12 units base with 2-10 units sliding scale.  160-199=2u, 200-239=3u, 240-279=4u, 280-319=5u,320-359=6u,360-399=7u,400-439=8u,440-479=9u,480 and above= 10u     isosorbide mononitrate (IMDUR) 60 MG 24 hr tablet Take 60 mg by mouth daily.     losartan (COZAAR) 100 MG tablet Take 100 mg by mouth daily.     metoprolol tartrate (LOPRESSOR) 25 MG tablet Take 25 mg by mouth 2 (two) times daily.     Multiple Vitamins-Minerals (CENTRUM SILVER 50+WOMEN PO) Take 1 tablet by mouth daily.     PAPAYA ENZYME PO Take 3-4 tablets by mouth daily as needed (constipation).     simethicone (MYLICON) 125 MG chewable tablet Chew 125 mg by mouth See admin instructions. 2 tabs in the am, 1 tab hs     ticagrelor (BRILINTA) 60 MG TABS tablet Take 60 mg by mouth 2 (two) times daily.     Zinc 50 MG TABS Take 50 mg by mouth daily.     No current facility-administered medications on file prior to visit.    Allergies:   Allergies  Allergen Reactions   Sulfa Antibiotics Hives    Physical Exam General: Obese elderly African-American lady sitting in a wheelchair, seated, in no evident distress Head: head normocephalic and atraumatic.   Neck: supple with no carotid or supraclavicular bruits Cardiovascular: regular rate and rhythm, no murmurs Musculoskeletal: no deformity Skin:  no rash/petichiae 2+ pedal edema up to mid thighs.  Skin of her lower extremities is thickened bilaterally Vascular:  Normal pulses all extremities  Neurologic Exam Mental Status: Awake and fully alert. Oriented to place and time. Recent and remote memory intact. Attention span, concentration and fund of knowledge appropriate. Mood and affect appropriate.  Diminished recall 1/3.  No  aphasia apraxia or dysarthria Cranial Nerves: Fundoscopic exam reveals sharp disc margins. Pupils equal, briskly reactive to light. Extraocular movements full without nystagmus. Visual fields full to confrontation. Hearing intact. Facial sensation intact. Face, tongue, palate moves normally and symmetrically.  Motor: Normal bulk and tone.  Mild weakness of bilateral lower extremities left greater than right likely limited due to hip pain. Sensory.: intact to touch , pinprick , position and vibratory sensation.  Coordination: Rapid alternating movements normal in all extremities. Finger-to-nose and heel-to-shin performed accurately bilaterally. Gait and Station: Not tested as patient is unable to walk due to hip pain and is in a wheelchair  reflexes: 1+ and symmetric. Toes downgoing.   NIHSS  3  Modified Rankin  4   ASSESSMENT: 87 year old African-American lady with right hemiparesis likely due to small left subcortical infarct in August 2021 while in [place].  Unfortunately prior neurological records about stroke evaluation not available today but she seems to be doing well except mild residual right hand weakness.  Vascular risk factors of obesity, hypertension, hyperlipidemia and diabetes.     PLAN: I had a long discussion with the patient and her daughter regarding her remote stroke and residual right hand weakness and answered questions about stroke prevention and treatment.  I recommend she continue Brilinta for secondary stroke prevention with strict control of hypertension with blood pressure goal below 130/90, lipids with LDL cholesterol goal below 70 mg percent and diabetes with hemoglobin A1c goal below 6.5%.  She was encouraged to eat a healthy diet with lots of fruits, vegetables, cereals and whole grains.  Continue follow-up with her medical doctor for her hip pain and if she is able to walk use a walker at all times and.  Check screening follow-up carotid ultrasound and transcranial  Doppler studies as well as lipid profile and hemoglobin A1c.  She will return for follow-up in the future in 3 months with my nurse practitioner Shanda Bumps or call earlier if necessary.  Greater than 50% time during this 45-minute consultation visit were spent on counseling and coordination of care about her stroke and hand weakness and discussion about stroke prevention and treatment and answering questions. Delia Heady, MD  Note: This document was prepared with digital dictation and possible smart phrase technology. Any transcriptional errors that result from this process are unintentional.

## 2020-09-25 LAB — LIPID PANEL
Chol/HDL Ratio: 1.9 ratio (ref 0.0–4.4)
Cholesterol, Total: 113 mg/dL (ref 100–199)
HDL: 60 mg/dL (ref >39)
LDL Chol Calc (NIH): 41 mg/dL (ref 0–99)
Triglycerides: 51 mg/dL (ref 0–149)
VLDL Cholesterol Cal: 12 mg/dL (ref 5–40)

## 2020-09-25 LAB — HEMOGLOBIN A1C
Est. average glucose Bld gHb Est-mCnc: 146 mg/dL
Hgb A1c MFr Bld: 6.7 % — ABNORMAL HIGH (ref 4.8–5.6)

## 2020-09-26 ENCOUNTER — Telehealth: Payer: Self-pay | Admitting: Emergency Medicine

## 2020-09-26 NOTE — Telephone Encounter (Signed)
-----   Message from Micki Riley, MD sent at 09/26/2020  8:13 AM EDT ----- Joneen Roach inform the patient that lab work for cholesterol profile is satisfactory but screening test for diabetes is abnormal and patient needs to see primary care physician for further advice on management of this ----- Message ----- From: Interface, [facility] Lab Results In Sent: 09/25/2020   5:37 AM EDT To: Micki Riley, MD

## 2020-09-26 NOTE — Telephone Encounter (Signed)
Reached patient on the phone, went over Dr. Marlis Edelson review and recommendation from blood work results.    Patient denied further questions, verbalized understanding and expressed appreciation for the phone call.

## 2020-09-27 ENCOUNTER — Telehealth: Payer: Self-pay | Admitting: Neurology

## 2020-09-27 NOTE — Telephone Encounter (Signed)
ready to be scheduled  [employer] medicare no auth require. Sent message to Lupita Leash she will reach ou to the patient to schedule.

## 2020-10-03 ENCOUNTER — Other Ambulatory Visit: Payer: Self-pay | Admitting: Family Medicine

## 2020-10-03 DIAGNOSIS — Z13820 Encounter for screening for osteoporosis: Secondary | ICD-10-CM

## 2020-10-03 DIAGNOSIS — Z780 Asymptomatic menopausal state: Secondary | ICD-10-CM

## 2020-10-11 ENCOUNTER — Ambulatory Visit (HOSPITAL_BASED_OUTPATIENT_CLINIC_OR_DEPARTMENT_OTHER)
Admission: RE | Admit: 2020-10-11 | Discharge: 2020-10-11 | Disposition: A | Discharge status: Home / Self Care | Payer: Medicare Other | Source: Ambulatory Visit | Attending: Neurology | Admitting: Neurology

## 2020-10-11 ENCOUNTER — Other Ambulatory Visit: Payer: Self-pay

## 2020-10-11 ENCOUNTER — Ambulatory Visit (HOSPITAL_COMMUNITY)
Admission: RE | Admit: 2020-10-11 | Discharge: 2020-10-11 | Disposition: A | Discharge status: Home / Self Care | Payer: Medicare Other | Source: Ambulatory Visit | Attending: Family Medicine | Admitting: Family Medicine

## 2020-10-11 DIAGNOSIS — Z8673 Personal history of transient ischemic attack (TIA), and cerebral infarction without residual deficits: Secondary | ICD-10-CM

## 2020-10-11 DIAGNOSIS — E785 Hyperlipidemia, unspecified: Secondary | ICD-10-CM | POA: Diagnosis not present

## 2020-10-11 DIAGNOSIS — Z950 Presence of cardiac pacemaker: Secondary | ICD-10-CM | POA: Insufficient documentation

## 2020-10-11 DIAGNOSIS — I252 Old myocardial infarction: Secondary | ICD-10-CM | POA: Diagnosis not present

## 2020-10-11 DIAGNOSIS — J449 Chronic obstructive pulmonary disease, unspecified: Secondary | ICD-10-CM | POA: Diagnosis not present

## 2020-10-11 DIAGNOSIS — I700 Atherosclerosis of aorta: Secondary | ICD-10-CM | POA: Diagnosis not present

## 2020-10-11 DIAGNOSIS — E119 Type 2 diabetes mellitus without complications: Secondary | ICD-10-CM | POA: Diagnosis not present

## 2020-10-11 DIAGNOSIS — I10 Essential (primary) hypertension: Secondary | ICD-10-CM | POA: Diagnosis not present

## 2020-10-11 LAB — ECHOCARDIOGRAM COMPLETE: S' Lateral: 3.4 cm

## 2020-10-11 NOTE — Progress Notes (Signed)
TCD and Carotid duplex has been completed.   Preliminary results in CV Proc.   Aundra Millet Irish Piech 10/11/2020 1:53 PM

## 2020-10-11 NOTE — Progress Notes (Signed)
  Echocardiogram 2D Echocardiogram has been performed.  Terri Owen 10/11/2020, 12:43 PM

## 2020-10-22 ENCOUNTER — Encounter: Payer: Self-pay | Admitting: Cardiology

## 2020-10-22 ENCOUNTER — Other Ambulatory Visit: Payer: Self-pay

## 2020-10-22 ENCOUNTER — Ambulatory Visit (INDEPENDENT_AMBULATORY_CARE_PROVIDER_SITE_OTHER): Payer: Medicare Other | Admitting: Cardiology

## 2020-10-22 DIAGNOSIS — I447 Left bundle-branch block, unspecified: Secondary | ICD-10-CM | POA: Diagnosis not present

## 2020-10-22 DIAGNOSIS — Z950 Presence of cardiac pacemaker: Secondary | ICD-10-CM | POA: Diagnosis not present

## 2020-10-22 DIAGNOSIS — Z8673 Personal history of transient ischemic attack (TIA), and cerebral infarction without residual deficits: Secondary | ICD-10-CM

## 2020-10-22 DIAGNOSIS — I10 Essential (primary) hypertension: Secondary | ICD-10-CM

## 2020-10-22 DIAGNOSIS — E119 Type 2 diabetes mellitus without complications: Secondary | ICD-10-CM

## 2020-10-22 DIAGNOSIS — R001 Bradycardia, unspecified: Secondary | ICD-10-CM | POA: Diagnosis not present

## 2020-10-22 DIAGNOSIS — I779 Disorder of arteries and arterioles, unspecified: Secondary | ICD-10-CM | POA: Insufficient documentation

## 2020-10-22 DIAGNOSIS — R609 Edema, unspecified: Secondary | ICD-10-CM

## 2020-10-22 DIAGNOSIS — I6523 Occlusion and stenosis of bilateral carotid arteries: Secondary | ICD-10-CM

## 2020-10-22 NOTE — Patient Instructions (Signed)
Medication Instructions:  The current medical regimen is effective;  continue present plan and medications.  *If you need a refill on your cardiac medications before your next appointment, please call your pharmacy*  Follow-Up: At [facility], you and your health needs are our priority.  As part of our continuing mission to provide you with exceptional heart care, we have created designated Provider Care Teams.  These Care Teams include your primary Cardiologist (physician) and Advanced Practice Providers (APPs -  Physician Assistants and Nurse Practitioners) who all work together to provide you with the care you need, when you need it.  We recommend signing up for the patient portal called "MyChart".  Sign up information is provided on this After Visit Summary.  MyChart is used to connect with patients for Virtual Visits (Telemedicine).  Patients are able to view lab/test results, encounter notes, upcoming appointments, etc.  Non-urgent messages can be sent to your provider as well.   To learn more about what you can do with MyChart, go to [email or web address].com.    Your next appointment:   1 year(s)  The format for your next appointment:   In Person  Provider:   Mark Skains, MD   Thank you for choosing [facility]!!    

## 2020-10-22 NOTE — Assessment & Plan Note (Signed)
Carotid Dopplers reviewed.  Left greater than right but moderate disease.  Continue with goal-directed medical therapy.

## 2020-10-22 NOTE — Assessment & Plan Note (Signed)
Resulted in pacemaker placement, sick sinus syndrome.

## 2020-10-22 NOTE — Assessment & Plan Note (Signed)
Bilateral lower extremity edema noted.  She is on Lasix.  She also has a wedge pillow to help with this.  Conservative measures.

## 2020-10-22 NOTE — Assessment & Plan Note (Signed)
Her insulin dosage has been adjusted by endocrinology.  Overall doing well.  Prior hemoglobin A1c 6.7.

## 2020-10-22 NOTE — Assessment & Plan Note (Signed)
Stable no syncope.  Pacemaker in place.

## 2020-10-22 NOTE — Assessment & Plan Note (Signed)
Medtronic pacemaker followed by Dr. Lalla Brothers now.  Just replaced in 2021, [place].  Doing well.  Device clinic is following.

## 2020-10-22 NOTE — Progress Notes (Signed)
Cardiology Office Note:    Date:  10/22/2020   ID:  Terri Owen, DOB Dec 26, 1933, MRN [id number]  PCP:  Cleatis Polka., MD   [facility] Providers Cardiologist:  Donato Schultz, MD Electrophysiologist:  Lanier Prude, MD     Referring MD: Karl Ito, DO     History of Present Illness:    Terri Owen is a 87 y.o. female with sinus node dysfunction prior permanent pacemaker in August 2021 now followed by Dr. Lalla Brothers.  Has hypertension.  Overall reasonable control.  Prior stroke November 2021.  Overall doing well.  Left greater than right carotid disease but only moderate.  Past Medical History:  Diagnosis Date   Arthritis    Chronic abdominal pain    COPD (chronic obstructive pulmonary disease) (HCC)    Diabetes mellitus type 2 in obese (HCC)    Diverticulitis    DM (diabetes mellitus) (HCC)    H/O hernia repair    H/O: stroke    Hemiparesis (HCC)    History of bilateral knee replacement    History of MI (myocardial infarction)    HTN (hypertension)    IBS (irritable bowel syndrome)    Morbidly obese (HCC)    Pacemaker     Past Surgical History:  Procedure Laterality Date   CHOLECYSTECTOMY     HERNIA REPAIR     PACEMAKER INSERTION     REPLACEMENT TOTAL KNEE BILATERAL      Current Medications: Current Meds  Medication Sig   amoxicillin-clavulanate (AUGMENTIN) 500-125 MG tablet Take 1 tablet by mouth See admin instructions. Every 8 hours x 10 days   atorvastatin (LIPITOR) 40 MG tablet Take 40 mg by mouth daily.   Cholecalciferol (VITAMIN D3) 50 MCG (2000 UT) CAPS Take 2,000 Units by mouth daily.   DULoxetine (CYMBALTA) 30 MG capsule Take 30 mg by mouth daily.   ezetimibe (ZETIA) 10 MG tablet Take 10 mg by mouth daily.   furosemide (LASIX) 20 MG tablet Take 20 mg by mouth daily.   insulin glargine (LANTUS) 100 unit/mL SOPN Inject 20 Units into the skin at bedtime.   insulin lispro (HUMALOG) 100 UNIT/ML injection Inject 10 Units into the skin See  admin instructions. 10 units base with 2-10 units sliding scale.  160-199=2u, 200-239=3u, 240-279=4u, 280-319=5u,320-359=6u,360-399=7u,400-439=8u,440-479=9u,480 and above= 10u   isosorbide mononitrate (IMDUR) 60 MG 24 hr tablet Take 60 mg by mouth daily.   losartan (COZAAR) 100 MG tablet Take 100 mg by mouth daily.   metoprolol tartrate (LOPRESSOR) 25 MG tablet Take 25 mg by mouth 2 (two) times daily.   Multiple Vitamins-Minerals (CENTRUM SILVER 50+WOMEN PO) Take 1 tablet by mouth daily.   PAPAYA ENZYME PO Take 3-4 tablets by mouth daily as needed (constipation).   simethicone (MYLICON) 125 MG chewable tablet Chew 125 mg by mouth See admin instructions. 2 tabs in the am, 1 tab hs   ticagrelor (BRILINTA) 60 MG TABS tablet Take 60 mg by mouth 2 (two) times daily.   Zinc 50 MG TABS Take 50 mg by mouth daily.     Allergies:   Sulfa antibiotics   Social History   Socioeconomic History   Marital status: Widowed    Spouse name: Not on file   Number of children: 6   Years of education: 10th grade   Highest education level: Not on file  Occupational History   Occupation: Retired  Tobacco Use   Smoking status: Never   Smokeless tobacco: Never  Substance and Sexual Activity  Alcohol use: Not Currently   Drug use: Never   Sexual activity: Not on file  Other Topics Concern   Not on file  Social History Narrative   Her daughter live with her at home.   Right-handed.   One cup coffee, one soda per day.   Social Determinants of Health   Financial Resource Strain: Not on file  Food Insecurity: Not on file  Transportation Needs: Not on file  Physical Activity: Not on file  Stress: Not on file  Social Connections: Not on file     Family History: The patient's family history includes Stroke in her father and mother.  ROS:   Please see the history of present illness.     All other systems reviewed and are negative.  EKGs/Labs/Other Studies Reviewed:      Recent Labs: No  results found for requested labs within last 8760 hours.  Recent Lipid Panel    Component Value Date/Time   CHOL 113 09/24/2020 1156   TRIG 51 09/24/2020 1156   HDL 60 09/24/2020 1156   CHOLHDL 1.9 09/24/2020 1156   LDLCALC 41 09/24/2020 1156     Risk Assessment/Calculations:          Physical Exam:    VS:  BP 140/70 (BP Location: Left Arm, Patient Position: Sitting, Cuff Size: Normal)   Pulse 60   Ht 5\' 3"  (1.6 m)   Wt 274 lb (124.3 kg)   SpO2 98%   BMI 48.54 kg/m     Wt Readings from Last 3 Encounters:  10/22/20 274 lb (124.3 kg)  06/26/20 278 lb (126.1 kg)  04/27/20 268 lb 6.4 oz (121.7 kg)     GEN:  Well nourished, well developed in no acute distress, in wheelchair HEENT: Normal NECK: No JVD; No carotid bruits LYMPHATICS: No lymphadenopathy CARDIAC: RRR, no murmurs, rubs, gallops RESPIRATORY:  Clear to auscultation without rales, wheezing or rhonchi  ABDOMEN: Soft, non-tender, non-distended MUSCULOSKELETAL:  chronic edema; No deformity  SKIN: Warm and dry NEUROLOGIC:  Alert and oriented x 3 PSYCHIATRIC:  Normal affect   ASSESSMENT:    1. Pacemaker   2. Bradycardia   3. Left bundle branch block   4. Diabetes mellitus with coincident hypertension (HCC)   5. Edema, unspecified type   6. History of stroke   7. Bilateral carotid artery stenosis    PLAN:    In order of problems listed above:  Pacemaker Medtronic pacemaker followed by Dr. 04/29/20 now.  Just replaced in 2021, 2022.  Doing well.  Device clinic is following.  Bradycardia Resulted in pacemaker placement, sick sinus syndrome.  Left bundle branch block Stable no syncope.  Pacemaker in place.  Diabetes mellitus with coincident hypertension (HCC) Her insulin dosage has been adjusted by endocrinology.  Overall doing well.  Prior hemoglobin A1c 6.7.  Edema Bilateral lower extremity edema noted.  She is on Lasix.  She also has a wedge pillow to help with this.  Conservative  measures.  History of stroke November 2021.  Note from Dr. December 2021 reviewed.  She was previously on both Brilinta as well as aspirin for stroke but ended up having severe nosebleeds.  Her aspirin was discontinued and she did well with Brilinta 60 mg twice a day.  Doing well.  She is on atorvastatin 40 mg high intensity dose.  Last LDL 51.  At goal.  Carotid artery disease (HCC) Carotid Dopplers reviewed.  Left greater than right but moderate disease.  Continue with goal-directed medical therapy.  Medication Adjustments/Labs and Tests Ordered: Current medicines are reviewed at length with the patient today.  Concerns regarding medicines are outlined above.  No orders of the defined types were placed in this encounter.  No orders of the defined types were placed in this encounter.   Patient Instructions  Medication Instructions:  The current medical regimen is effective;  continue present plan and medications.  *If you need a refill on your cardiac medications before your next appointment, please call your pharmacy*  Follow-Up: At [facility], you and your health needs are our priority.  As part of our continuing mission to provide you with exceptional heart care, we have created designated Provider Care Teams.  These Care Teams include your primary Cardiologist (physician) and Advanced Practice Providers (APPs -  Physician Assistants and Nurse Practitioners) who all work together to provide you with the care you need, when you need it.  We recommend signing up for the patient portal called "MyChart".  Sign up information is provided on this After Visit Summary.  MyChart is used to connect with patients for Virtual Visits (Telemedicine).  Patients are able to view lab/test results, encounter notes, upcoming appointments, etc.  Non-urgent messages can be sent to your provider as well.   To learn more about what you can do with MyChart, go to [email or web address].    Your next  appointment:   1 year(s)  The format for your next appointment:   In Person  Provider:   Donato Schultz, MD   Thank you for choosing [facility]!!     Signed, Donato Schultz, MD  10/22/2020 2:06 PM    [facility]

## 2020-10-22 NOTE — Assessment & Plan Note (Signed)
November 2021.  Note from Dr. Pearlean Brownie reviewed.  She was previously on both Brilinta as well as aspirin for stroke but ended up having severe nosebleeds.  Her aspirin was discontinued and she did well with Brilinta 60 mg twice a day.  Doing well.  She is on atorvastatin 40 mg high intensity dose.  Last LDL 51.  At goal.

## 2020-10-23 ENCOUNTER — Telehealth: Payer: Self-pay | Admitting: Emergency Medicine

## 2020-10-23 NOTE — Telephone Encounter (Signed)
-----   Message from Micki Riley, MD sent at 10/23/2020 12:13 PM EDT ----- Terri Owen inform the patient that carotid ultrasound study shows moderate 40 to 50% narrowing of the carotid artery on the left and no major blockage on the right.  This can be managed medically there is no need for surgery or stent at this time ----- Message ----- From: Lenise Arena, RN Sent: 10/18/2020  10:10 AM EDT To: Micki Riley, MD   ----- Message ----- From: Bobbye Morton, CMA Sent: 10/17/2020   4:48 PM EDT To: Gna-Pod 2 Results

## 2020-10-23 NOTE — Telephone Encounter (Signed)
Reached patient's daughter, Darel Hong with patient also in background.  Discussed Dr. Marlis Edelson review and findings regarding patient's Carotid Arterial Duplex study.  Neither patient nor daughter had any questions.  Patient denied further questions, verbalized understanding and expressed appreciation for the phone call.

## 2020-10-23 NOTE — Telephone Encounter (Signed)
-----   Message from Micki Riley, MD sent at 10/23/2020 12:11 PM EDT ----- Joneen Roach inform the patient that transcranial Doppler study was suboptimal due to poor bony windows not all vessels could be studied however we did not find significant blockages or anything to worry about. ----- Message ----- From: Lenise Arena, RN Sent: 10/18/2020  10:10 AM EDT To: Micki Riley, MD   ----- Message ----- From: Bobbye Morton, CMA Sent: 10/17/2020   4:46 PM EDT To: Gna-Pod 2 Results

## 2020-10-23 NOTE — Telephone Encounter (Signed)
Reached patient's daughter, Terri Owen with patient also in background.  Discussed Dr. Marlis Edelson review and findings regarding patient's transcranial doppler study.  Neither patient nor daughter had any questions.  Patient denied further questions, verbalized understanding and expressed appreciation for the phone call.

## 2021-01-01 ENCOUNTER — Other Ambulatory Visit: Payer: Self-pay

## 2021-01-01 ENCOUNTER — Ambulatory Visit (INDEPENDENT_AMBULATORY_CARE_PROVIDER_SITE_OTHER): Payer: Medicare Other | Admitting: Adult Health

## 2021-01-01 ENCOUNTER — Encounter: Payer: Self-pay | Admitting: Adult Health

## 2021-01-01 VITALS — BP 160/98 | HR 81

## 2021-01-01 DIAGNOSIS — R29898 Other symptoms and signs involving the musculoskeletal system: Secondary | ICD-10-CM | POA: Diagnosis not present

## 2021-01-01 DIAGNOSIS — I639 Cerebral infarction, unspecified: Secondary | ICD-10-CM | POA: Diagnosis not present

## 2021-01-01 NOTE — Progress Notes (Signed)
[facility] [address] [place]. [place] [address] [phone]       OFFICE FOLLOW UP NOTE  [name] Date of Birth:  20-Jul-1933 Medical Record Number:  [id number]   Primary neurologist: Dr. Pearlean Brownie Referring MD: Dominica Severin, DO  Reason for Referral: Stroke follow-up  Chief Complaint  Patient presents with   Follow-up    Room 3 with daughter, states she is doing well and stable, in wheelchair       HPI:   Update 01/01/2021 JM: Returns for 85-month stroke follow-up  Overall stable -denies new stroke/TIA symptoms Mild right hand weakness - no improvement since prior visit. Greater difficulty with fine motor control  Remains nonambulatory in setting of chronic hip pain  Does c/o frequent voiding (?OAB) - recently starting on Myrbetriq by urology which has been helping but daughter concerned this has caused some increased blood pressure - has not been routinely monitoring at home but as elevated today, still plans on starting  Compliant on Brilinta and atorvastatin -denies side effects Blood pressure today 172/99 and on recheck 160/98  Recent A1c 5.2 -managed by PCP - recently decreased insulin levels. Trying to get established with endocrinology  Routinely followed by PCP and cardiology routinely    History provided for reference purposes only Consult visit 09/22/2020 Dr. Pearlean Brownie: Terri Owen is a 87 year old African-American lady seen today for initial office consultation visit for stroke.  She is accompanied by her daughter Terri Owen.  History is obtained from them and review of referral notes.  No imaging studies , results or electronic medical records available for review today.  Patient has past medical history of diabetes, hypertension, hyperlipidemia, irritable bowel syndrome, pacemaker and COPD.  She states she was living in [place] , [place] of [place] when she had a stroke in August 2021.  She had some initial speech difficulties  and right hemiparesis.  She was admitted there for a few days and then recovered enough to be discharged home get some home therapies.  She still has some residual right grip weakness and diminished fine motor skills but speech and walking had returned back to normal.  She was in fact independent with actives of daily living until a few months ago when she fell and she has been having chronic hip pain.  She has not a candidate for surgery due to her poor general medical health.  Patient spends most of the time now ambulating in a wheelchair.  She has not had any recurrent stroke or TIA symptoms.  She has been placed on Brilinta which is tolerating well at present without bleeding or bruising.  She was on aspirin and Brilinta in the past but developed nasal bleeding and had to see ENT and aspirin was discontinued.  She had lipid profile checked on 02/24/2020 which showed LDL cholesterol 43 mg percent and hemoglobin A1c of 6.6.  She has not had any follow-up carotid ultrasound done recently.    ROS:   14 system review of systems is positive for those listed in HPI all other systems negative  PMH:  Past Medical History:  Diagnosis Date   Arthritis    Chronic abdominal pain    COPD (chronic obstructive pulmonary disease) (HCC)    Diabetes mellitus type 2 in obese (HCC)    Diverticulitis    DM (diabetes mellitus) (HCC)    H/O hernia repair    H/O: stroke    Hemiparesis (HCC)    History of bilateral knee replacement  History of MI (myocardial infarction)    HTN (hypertension)    IBS (irritable bowel syndrome)    Morbidly obese (HCC)    Pacemaker     Social History:  Social History   Socioeconomic History   Marital status: Widowed    Spouse name: Not on file   Number of children: 6   Years of education: 10th grade   Highest education level: Not on file  Occupational History   Occupation: Retired  Tobacco Use   Smoking status: Never   Smokeless tobacco: Never  Substance and Sexual  Activity   Alcohol use: Not Currently   Drug use: Never   Sexual activity: Not on file  Other Topics Concern   Not on file  Social History Narrative   Her daughter live with her at home.   Right-handed.   One cup coffee, one soda per day.   Social Determinants of Health   Financial Resource Strain: Not on file  Food Insecurity: Not on file  Transportation Needs: Not on file  Physical Activity: Not on file  Stress: Not on file  Social Connections: Not on file  Intimate Partner Violence: Not on file    Medications:   Current Outpatient Medications on File Prior to Visit  Medication Sig Dispense Refill   amoxicillin-clavulanate (AUGMENTIN) 500-125 MG tablet Take 1 tablet by mouth See admin instructions. Every 8 hours x 10 days     atorvastatin (LIPITOR) 40 MG tablet Take 40 mg by mouth daily.     Cholecalciferol (VITAMIN D3) 50 MCG (2000 UT) CAPS Take 2,000 Units by mouth daily.     DULoxetine (CYMBALTA) 30 MG capsule Take 30 mg by mouth daily.     ezetimibe (ZETIA) 10 MG tablet Take 10 mg by mouth daily.     furosemide (LASIX) 20 MG tablet Take 20 mg by mouth daily.     insulin glargine (LANTUS) 100 unit/mL SOPN Inject 20 Units into the skin at bedtime.     insulin lispro (HUMALOG) 100 UNIT/ML injection Inject 10 Units into the skin See admin instructions. 10 units base with 2-10 units sliding scale.  160-199=2u, 200-239=3u, 240-279=4u, 280-319=5u,320-359=6u,360-399=7u,400-439=8u,440-479=9u,480 and above= 10u     isosorbide mononitrate (IMDUR) 60 MG 24 hr tablet Take 60 mg by mouth daily.     losartan (COZAAR) 100 MG tablet Take 100 mg by mouth daily.     metoprolol tartrate (LOPRESSOR) 25 MG tablet Take 25 mg by mouth 2 (two) times daily.     Multiple Vitamins-Minerals (CENTRUM SILVER 50+WOMEN PO) Take 1 tablet by mouth daily.     MYRBETRIQ 50 MG TB24 tablet Take 50 mg by mouth daily.     PAPAYA ENZYME PO Take 3-4 tablets by mouth daily as needed (constipation).     simethicone  (MYLICON) 125 MG chewable tablet Chew 125 mg by mouth See admin instructions. 2 tabs in the am, 1 tab hs     ticagrelor (BRILINTA) 60 MG TABS tablet Take 60 mg by mouth 2 (two) times daily.     Zinc 50 MG TABS Take 50 mg by mouth daily.     No current facility-administered medications on file prior to visit.    Allergies:   Allergies  Allergen Reactions   Sulfa Antibiotics Hives    Physical Exam Today's Vitals   01/01/21 1102 01/01/21 1135  BP: (!) 172/99 (!) 160/98  Pulse: 81    There is no height or weight on file to calculate BMI.   General: Obese  elderly African-American lady sitting in a wheelchair, seated, in no evident distress Head: head normocephalic to and atraumatic.   Neck: supple with no carotid or supraclavicular bruits Cardiovascular: regular rate and rhythm, no murmurs Musculoskeletal: no deformity Skin:  no rash/petichiae 2+ pedal edema up to mid thighs.  Skin of her lower extremities is thickened bilaterally Vascular:  Normal pulses all extremities  Neurologic Exam Mental Status: Awake and fully alert. Oriented to place and time. Recent slightly impaired and remote memory intact. Attention span, concentration and fund of knowledge appropriate during visit. Mood and affect appropriate.  No aphasia apraxia or dysarthria Cranial Nerves: Pupils equal, briskly reactive to light. Extraocular movements full without nystagmus. Visual fields full to confrontation. Hearing intact. Facial sensation intact. Face, tongue, palate moves normally and symmetrically.  Motor: Normal bulk and tone.  Normal strength in all tested extremities except mild right hand fine motor control weakness Sensory.: intact to touch , pinprick , position and vibratory sensation.  Coordination: Rapid alternating movements normal in all extremities except difficulty performing right hand. Finger-to-nose performed accurately bilaterally and heel-to-shin difficulty performing. Gait and Station:  Nonambulatory reflexes: 1+ and symmetric. Toes downgoing.       ASSESSMENT: Terri Owen is a 87 year old African-American lady with right hemiparesis likely due to small left subcortical infarct in August 2021 while in [place].  Unfortunately prior neurological records about stroke evaluation not available today but she seems to be doing well except mild residual right hand weakness.  Vascular risk factors of obesity, hypertension, hyperlipidemia and diabetes.  CUS 10/23/2020 L ICA 40 to 50% stenosis Stroke labs 09/24/20: A1c 6.7, LDL 41    PLAN:  -provided exercises do to at home for continued mild right hand fine motor control weakness -continue Brilinta 60 mg twice daily and atorvastatin 40 mg daily for secondary stroke prevention  - BP elevated today even on recheck - advised to monitor at home and if remains elevated to ensure she contacts urology to discuss possible need to adjusting Myrbetriq dosage -Continue routine follow-up with PCP/cardiology for strict control of hypertension with blood pressure goal below 130/90, lipids with LDL cholesterol goal below 70 mg percent and diabetes with hemoglobin A1c goal below 7.0%.   -carotid stenosis monitored by cardiology   Follow-up in 6 months or call earlier if needed - if remains stable, request consolidating care with PCP and f/u as needed    CC:  Cleatis Polka., MD   I spent 34 minutes of face-to-face and non-face-to-face time with patient and daughter.  This included previsit chart review, lab review, study review, electronic health record documentation, patient and daughter education and discussion regarding prior stroke with residual deficits, secondary stroke prevention measures and aggressive stroke risk factor management, OAB and new medication and potential side effects and answered all other questions to patient and daughter satisfaction  Ihor Austin, AGNP-BC  [facility] [address]  [address] [place], [place] [address]  Phone [phone] Fax [phone] Note: This document was prepared with digital dictation and possible smart phrase technology. Any transcriptional errors that result from this process are unintentional.

## 2021-01-01 NOTE — Patient Instructions (Addendum)
Continue Brilinta (ticagrelor) 90 mg bid  and atorvastatin  for secondary stroke prevention  Continue to follow up with PCP/cardiology regarding cholesterol and blood pressure management  Maintain strict control of hypertension with blood pressure goal below 130/90 and cholesterol with LDL cholesterol (bad cholesterol) goal below 70 mg/dL.   Continue to do fine motor skills at home (exercises provided on the last page)     Followup in the future with me in 6 months or call earlier if needed      Thank you for coming to see [place] at [facility]. I hope we have been able to provide you high quality care today.  You may receive a patient satisfaction survey over the next few weeks. We would appreciate your feedback and comments so that we may continue to improve ourselves and the health of our patients.

## 2021-03-06 DEATH — deceased

## 2021-03-19 ENCOUNTER — Other Ambulatory Visit: Payer: Medicare Other

## 2021-04-03 DEATH — deceased

## 2021-07-03 ENCOUNTER — Ambulatory Visit: Payer: Medicare Other | Admitting: Adult Health

## 2021-10-23 IMAGING — DX DG KNEE COMPLETE 4+V*L*
4 series · 4 of 4 positions shown · non-contrast
Comparison: No pertinent prior exams available for comparison.

CLINICAL DATA: Pain in left hip from fall 8 days ago. Bilateral
knee pain.

EXAM:
LEFT KNEE - COMPLETE 4+ VIEW

[knee ap]
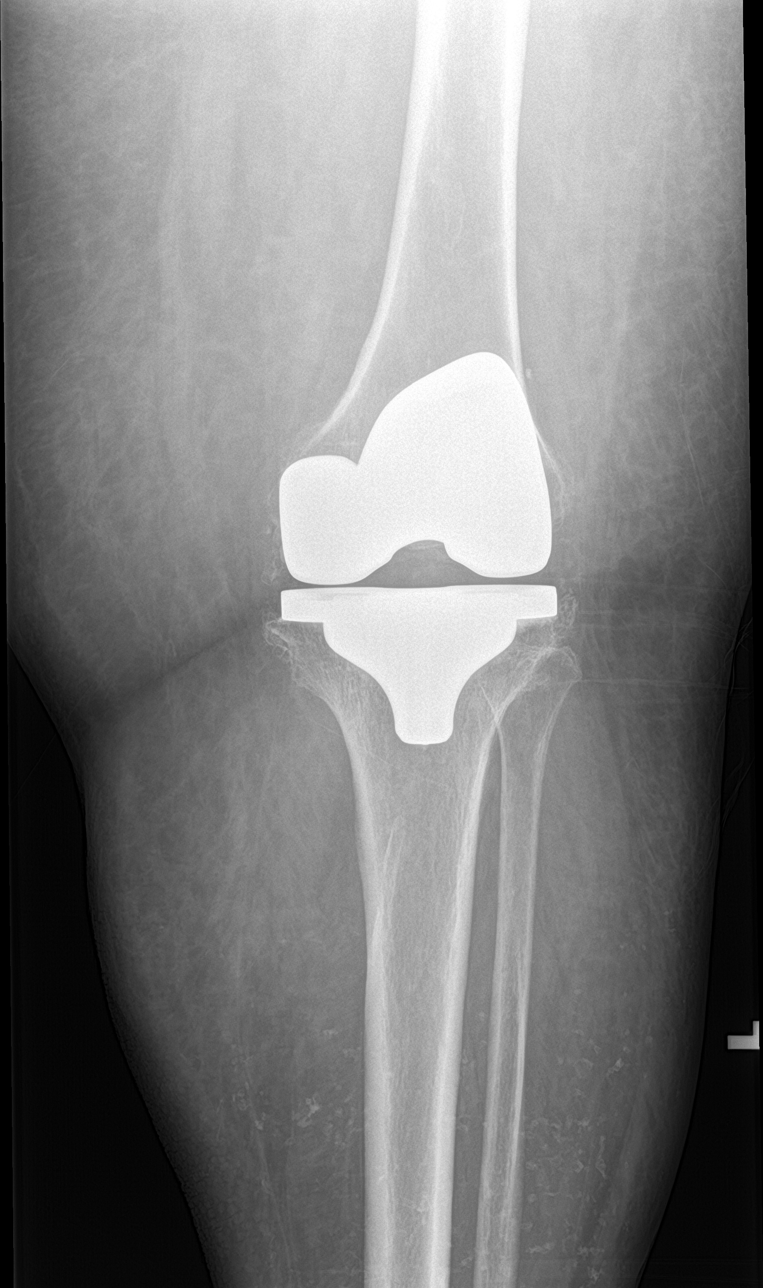

[knee lat]
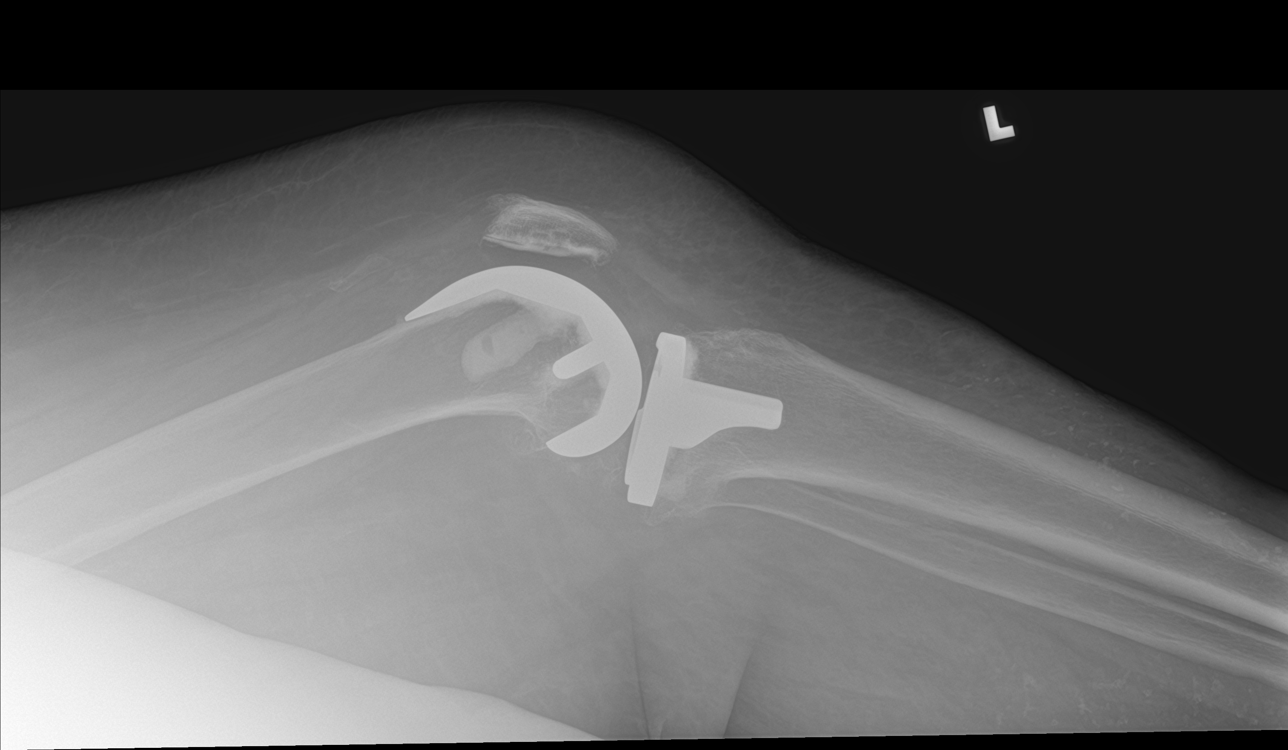

[knee obl (1 of 2)]
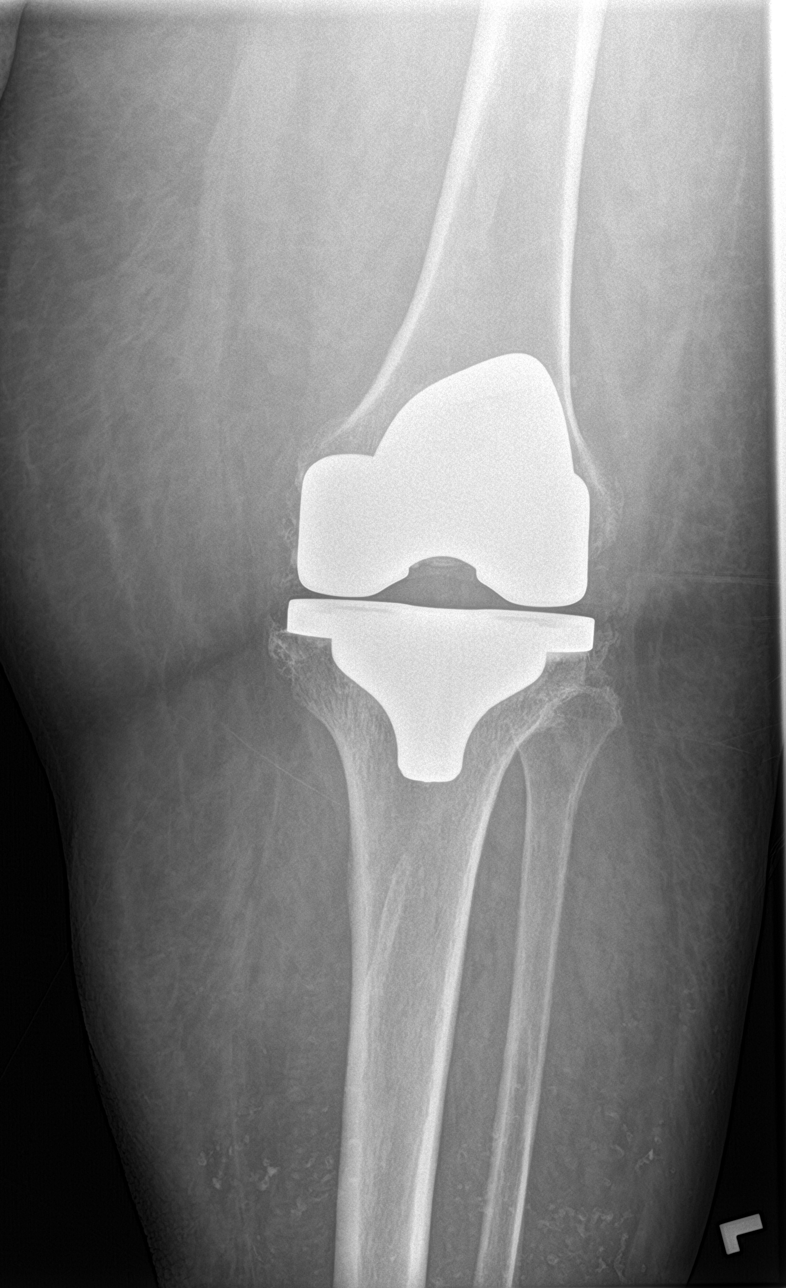

[knee obl (2 of 2)]
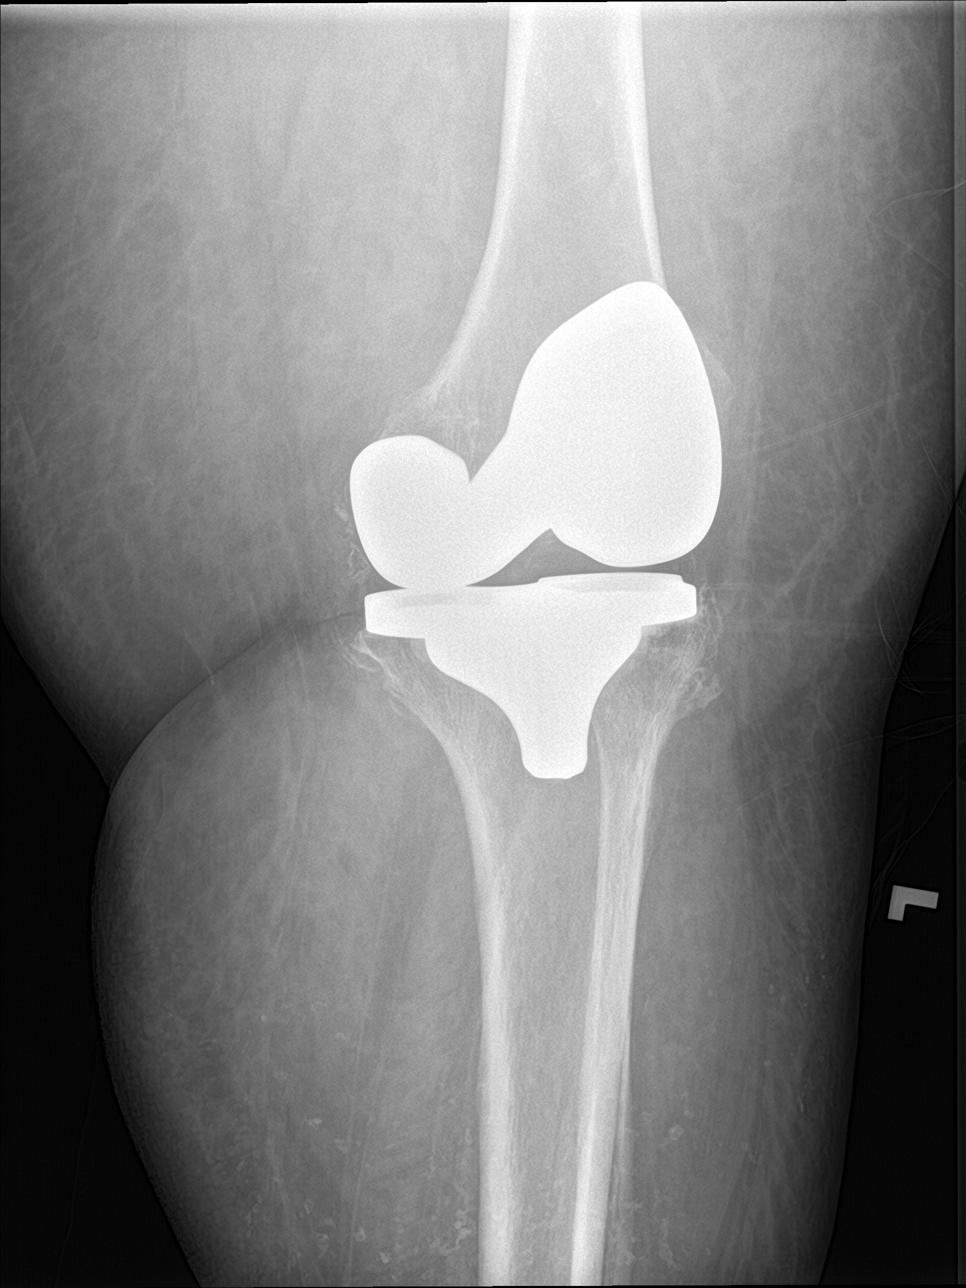

[4 of 4 positions shown; findings below may reference images not displayed]

FINDINGS: Prior left knee arthroplasty. No evidence of hardware compromise.
The femoral and tibial components appear well seated.

There is normal bony alignment.

No evidence of acute osseous or articular abnormality.

The joint spaces are maintained.
IMPRESSION: No evidence of acute osseous or articular abnormality.

Prior left knee arthroplasty.
